# Patient Record
Sex: Male | Born: 1995 | Hispanic: Yes | Marital: Single | Smoking: Current every day smoker
Health system: Southern US, Community
[De-identification: ages and names within clinical notes are randomized; demographics above are authoritative.]

## PROBLEM LIST (undated history)

## (undated) DIAGNOSIS — F1010 Alcohol abuse, uncomplicated: Secondary | ICD-10-CM

## (undated) HISTORY — DX: Alcohol abuse, uncomplicated: F10.10

---

## 2019-09-04 ENCOUNTER — Other Ambulatory Visit: Payer: Self-pay

## 2019-09-04 ENCOUNTER — Emergency Department (HOSPITAL_COMMUNITY)
Admission: EM | Admit: 2019-09-04 | Discharge: 2019-09-04 | Disposition: A | Discharge status: Home / Self Care | Payer: Self-pay | Attending: Emergency Medicine | Admitting: Emergency Medicine

## 2019-09-04 ENCOUNTER — Emergency Department (HOSPITAL_COMMUNITY): Payer: Self-pay

## 2019-09-04 ENCOUNTER — Encounter (HOSPITAL_COMMUNITY): Payer: Self-pay

## 2019-09-04 DIAGNOSIS — R1011 Right upper quadrant pain: Secondary | ICD-10-CM | POA: Insufficient documentation

## 2019-09-04 LAB — COMPREHENSIVE METABOLIC PANEL
ALT: 35 U/L (ref 0–44)
AST: 31 U/L (ref 15–41)
Albumin: 5 g/dL (ref 3.5–5.0)
Alkaline Phosphatase: 69 U/L (ref 38–126)
Anion gap: 16 — ABNORMAL HIGH (ref 5–15)
BUN: <5 mg/dL — ABNORMAL LOW (ref 6–20)
CO2: 25 mmol/L (ref 22–32)
Calcium: 10.2 mg/dL (ref 8.9–10.3)
Chloride: 98 mmol/L (ref 98–111)
Creatinine, Ser: 0.9 mg/dL (ref 0.61–1.24)
GFR calc Af Amer: >60 mL/min (ref >60)
GFR calc non Af Amer: >60 mL/min (ref >60)
Glucose, Bld: 103 mg/dL — ABNORMAL HIGH (ref 70–99)
Potassium: 3.6 mmol/L (ref 3.5–5.1)
Sodium: 139 mmol/L (ref 135–145)
Total Bilirubin: 1.3 mg/dL — ABNORMAL HIGH (ref 0.3–1.2)
Total Protein: 7.4 g/dL (ref 6.5–8.1)

## 2019-09-04 LAB — LIPASE, BLOOD: Lipase: 22 U/L (ref 11–51)

## 2019-09-04 LAB — CBC
HCT: 46.4 % (ref 39.0–52.0)
Hemoglobin: 15.8 g/dL (ref 13.0–17.0)
MCH: 28.7 pg (ref 26.0–34.0)
MCHC: 34.1 g/dL (ref 30.0–36.0)
MCV: 84.4 fL (ref 80.0–100.0)
Platelets: 241 10*3/uL (ref 150–400)
RBC: 5.5 MIL/uL (ref 4.22–5.81)
RDW: 12.8 % (ref 11.5–15.5)
WBC: 11.9 10*3/uL — ABNORMAL HIGH (ref 4.0–10.5)
nRBC: 0 % (ref 0.0–0.2)

## 2019-09-04 MED ORDER — PANTOPRAZOLE SODIUM 20 MG PO TBEC: 20.0000 mg | DELAYED_RELEASE_TABLET | Freq: Two times a day (BID) | ORAL | 0 refills | Status: AC

## 2019-09-04 MED ORDER — ONDANSETRON 8 MG PO TBDP: 8.0000 mg | ORAL_TABLET | Freq: Three times a day (TID) | ORAL | 0 refills | Status: AC | PRN

## 2019-09-04 MED ORDER — ALUM & MAG HYDROXIDE-SIMETH 200-200-20 MG/5ML PO SUSP
30.0000 mL | Freq: Once | ORAL | Status: AC
  Administered 2019-09-04: 30 mL via ORAL
  Filled 2019-09-04: qty 30

## 2019-09-04 MED ORDER — FAMOTIDINE 20 MG PO TABS
20.0000 mg | ORAL_TABLET | Freq: Once | ORAL | Status: AC
  Administered 2019-09-04: 20 mg via ORAL
  Filled 2019-09-04: qty 1

## 2019-09-04 NOTE — ED Triage Notes (Signed)
Patient complains of right sided rib pain and pain with inspiration x 1 day. Denies trauma. Also concerned because he thinks he may have ETOH poisoning because of heavy daily use and complains of nausea and vomiting. Alert and oriented, NAD

## 2019-09-04 NOTE — Discharge Instructions (Addendum)
Take the medication as needed for nausea and for your stomach. Avoid drinking any alcohol. Follow-up with the outpatient resources as we discussed

## 2019-09-04 NOTE — ED Provider Notes (Signed)
[facility] EMERGENCY DEPARTMENT Provider Note   CSN: [id number] Arrival date & time: 09/04/19  1136     History Right upper quadrant abdominal pain  Bill Fowler is a 24 y.o. male.  HPI   Patient states he has been having pain in his right upper quadrant for the last day or so.  Started last night when he woke up this morning it was worse.  Pain is in the right upper abdomen.  It does increase with breathing.  He denies any fevers chills coughing.  He has had some episodes of nausea and vomiting and also gets worse after that.  Patient does have history of alcohol abuse.  Patient states he recently relapsed and has been drinking heavily for the last 3 days.  No known Covid exposure  History reviewed. No pertinent past medical history.  There are no problems to display for this patient.   History reviewed. No pertinent surgical history.     No family history on file.  Social History   Tobacco Use  . Smoking status: Not on file  Substance Use Topics  . Alcohol use: Not on file  . Drug use: Not on file    Home Medications Prior to Admission medications   Medication Sig Start Date End Date Taking? Authorizing Provider  ondansetron (ZOFRAN ODT) 8 MG disintegrating tablet Take 1 tablet (8 mg total) by mouth every 8 (eight) hours as needed for nausea or vomiting. 09/04/19   Linwood Dibbles, MD  pantoprazole (PROTONIX) 20 MG tablet Take 1 tablet (20 mg total) by mouth 2 (two) times daily. 09/04/19   Linwood Dibbles, MD    Allergies    Patient has no known allergies.  Review of Systems   Review of Systems  All other systems reviewed and are negative.   Physical Exam Updated Vital Signs BP (!) 165/82 (BP Location: Right Arm)   Pulse (!) 41   Temp 99.2 F (37.3 C) (Oral)   Resp 16   SpO2 100%   Physical Exam Vitals and nursing note reviewed.  Constitutional:      General: He is not in acute distress.    Appearance: He is well-developed.  HENT:     Head:  Normocephalic and atraumatic.     Right Ear: External ear normal.     Left Ear: External ear normal.  Eyes:     General: No scleral icterus.       Right eye: No discharge.        Left eye: No discharge.     Conjunctiva/sclera: Conjunctivae normal.  Neck:     Trachea: No tracheal deviation.  Cardiovascular:     Rate and Rhythm: Normal rate and regular rhythm.  Pulmonary:     Effort: Pulmonary effort is normal. No respiratory distress.     Breath sounds: Normal breath sounds. No stridor. No wheezing or rales.  Abdominal:     General: Bowel sounds are normal. There is no distension.     Palpations: Abdomen is soft.     Tenderness: There is abdominal tenderness in the right upper quadrant. There is no guarding or rebound.  Musculoskeletal:        General: No tenderness.     Cervical back: Neck supple.  Skin:    General: Skin is warm and dry.     Findings: No rash.  Neurological:     Mental Status: He is alert.     Cranial Nerves: No cranial nerve deficit (no facial droop,  extraocular movements intact, no slurred speech).     Sensory: No sensory deficit.     Motor: No abnormal muscle tone or seizure activity.     Coordination: Coordination normal.     Comments: No tremor     ED Results / Procedures / Treatments   Labs (all labs ordered are listed, but only abnormal results are displayed) Labs Reviewed  COMPREHENSIVE METABOLIC PANEL - Abnormal; Notable for the following components:      Result Value   Glucose, Bld 103 (*)    BUN <5 (*)    Total Bilirubin 1.3 (*)    Anion gap 16 (*)    All other components within normal limits  CBC - Abnormal; Notable for the following components:   WBC 11.9 (*)    All other components within normal limits  LIPASE, BLOOD  URINALYSIS, ROUTINE W REFLEX MICROSCOPIC  COMPREHENSIVE METABOLIC PANEL    EKG None  Radiology DG Chest 2 View  Result Date: 09/04/2019 CLINICAL DATA:  Rib pain.  Pain with inspiration.  No trauma. EXAM: CHEST -  2 VIEW COMPARISON:  None. FINDINGS: The heart size and mediastinal contours are within normal limits. Both lungs are clear. The visualized skeletal structures are unremarkable. IMPRESSION: No active cardiopulmonary disease. Electronically Signed   By: Gerome Sam III M.D   On: 09/04/2019 13:50   US Abdomen Limited RUQ  Result Date: 09/04/2019 CLINICAL DATA:  Right upper quadrant pain. EXAM: ULTRASOUND ABDOMEN LIMITED RIGHT UPPER QUADRANT COMPARISON:  None. FINDINGS: Gallbladder: A 3.9 cm nonshadowing echogenic polyp is seen along the nondependent portion of the gallbladder wall. No gallstones or wall thickening visualized (2.2 mm). No sonographic Murphy sign noted by sonographer. Common bile duct: Diameter: 1.8 mm Liver: No focal lesion identified. Within normal limits in parenchymal echogenicity. Portal vein is patent on color Doppler imaging with normal direction of blood flow towards the liver. Other: None. IMPRESSION: 1. 3.9 mm gallbladder polyp. 2. No evidence of cholelithiasis or acute cholecystitis. Electronically Signed   By: Aram Candela M.D.   On: 09/04/2019 20:17    Procedures Procedures (including critical care time)  Medications Ordered in ED Medications  famotidine (PEPCID) tablet 20 mg (20 mg Oral Given 09/04/19 2036)  alum & mag hydroxide-simeth (MAALOX/MYLANTA) 200-200-20 MG/5ML suspension 30 mL (30 mLs Oral Given 09/04/19 2035)    ED Course  I have reviewed the triage vital signs and the nursing notes.  Pertinent labs & imaging results that were available during my care of the patient were reviewed by me and considered in my medical decision making (see chart for details).  Clinical Course as of Sep 04 2050  Sat Sep 04, 2019  2038 [place] without signs of cholecystitis or cholelithiasis   [JK]  2039 Slight elevation in bilirubin and slight leukocytosis   [JK]  2039 Chest x-ray without signs of pneumonia.  Low risk for PE PERC negative   [JK]    Clinical Course User  Index [JK] Linwood Dibbles, MD   MDM Rules/Calculators/A&P                          Patient presented to ED for evaluation of upper abdominal pain pain with breathing. Patient does admit to heavy alcohol use. On my exam the patient did have tenderness palpation right upper quadrant. No fevers or cough. No shortness of breath. I doubt pulmonary embolism. No pneumonia on chest x-ray. No pneumothorax. I was concerned about the possibility  of biliary colic with his right upper quadrant tenderness. Ultrasound does not show gallstones or cholecystitis. It is possible his symptoms may be related to his alcohol binge recently. Patient is currently not intoxicated. He is going to try to get some outpatient help regarding his drinking. No signs of DTs or withdrawal. Plan on discharge home with antacids antinausea medications. Final Clinical Impression(s) / ED Diagnoses Final diagnoses:  RUQ pain  Right upper quadrant abdominal pain    Rx / DC Orders ED Discharge Orders         Ordered    pantoprazole (PROTONIX) 20 MG tablet  2 times daily        09/04/19 2051    ondansetron (ZOFRAN ODT) 8 MG disintegrating tablet  Every 8 hours PRN        09/04/19 2051           Linwood Dibbles, MD 09/04/19 2053

## 2019-10-05 ENCOUNTER — Encounter (HOSPITAL_COMMUNITY): Payer: Self-pay | Admitting: *Deleted

## 2019-10-05 ENCOUNTER — Emergency Department (HOSPITAL_COMMUNITY): Payer: Self-pay

## 2019-10-05 ENCOUNTER — Other Ambulatory Visit: Payer: Self-pay

## 2019-10-05 ENCOUNTER — Emergency Department (HOSPITAL_COMMUNITY)
Admission: EM | Admit: 2019-10-05 | Discharge: 2019-10-05 | Disposition: A | Discharge status: Home / Self Care | Payer: Self-pay | Attending: Emergency Medicine | Admitting: Emergency Medicine

## 2019-10-05 DIAGNOSIS — Z5321 Procedure and treatment not carried out due to patient leaving prior to being seen by health care provider: Secondary | ICD-10-CM | POA: Insufficient documentation

## 2019-10-05 DIAGNOSIS — R1110 Vomiting, unspecified: Secondary | ICD-10-CM | POA: Insufficient documentation

## 2019-10-05 DIAGNOSIS — R0789 Other chest pain: Secondary | ICD-10-CM | POA: Insufficient documentation

## 2019-10-05 LAB — TROPONIN I (HIGH SENSITIVITY): Troponin I (High Sensitivity): 3 ng/L (ref <18)

## 2019-10-05 LAB — BASIC METABOLIC PANEL
Anion gap: 10 (ref 5–15)
BUN: 6 mg/dL (ref 6–20)
CO2: 27 mmol/L (ref 22–32)
Calcium: 9.3 mg/dL (ref 8.9–10.3)
Chloride: 99 mmol/L (ref 98–111)
Creatinine, Ser: 0.91 mg/dL (ref 0.61–1.24)
GFR calc Af Amer: >60 mL/min (ref >60)
GFR calc non Af Amer: >60 mL/min (ref >60)
Glucose, Bld: 116 mg/dL — ABNORMAL HIGH (ref 70–99)
Potassium: 3.5 mmol/L (ref 3.5–5.1)
Sodium: 136 mmol/L (ref 135–145)

## 2019-10-05 LAB — CBC
HCT: 43.2 % (ref 39.0–52.0)
Hemoglobin: 14.5 g/dL (ref 13.0–17.0)
MCH: 29.1 pg (ref 26.0–34.0)
MCHC: 33.6 g/dL (ref 30.0–36.0)
MCV: 86.7 fL (ref 80.0–100.0)
Platelets: 229 10*3/uL (ref 150–400)
RBC: 4.98 MIL/uL (ref 4.22–5.81)
RDW: 12.9 % (ref 11.5–15.5)
WBC: 9.4 10*3/uL (ref 4.0–10.5)
nRBC: 0 % (ref 0.0–0.2)

## 2019-10-05 NOTE — ED Triage Notes (Signed)
Pt states started having chest pain last night.  Pt states that he had a etoh drinking binge and then stopped last nite.  Last ETOH was at 0100. Pt states he has a seizure before when he stopped drinking. Pt states left sided chest pain and has been vomiting.

## 2019-10-05 NOTE — ED Notes (Signed)
Name called fo updated vitals, no repsonse

## 2021-08-19 IMAGING — DX DG CHEST 2V
2 series · 2 of 2 positions shown · non-contrast
Comparison: None.

CLINICAL DATA: Rib pain.  Pain with inspiration.  No trauma.

EXAM:
CHEST - 2 VIEW

[w chest pa]
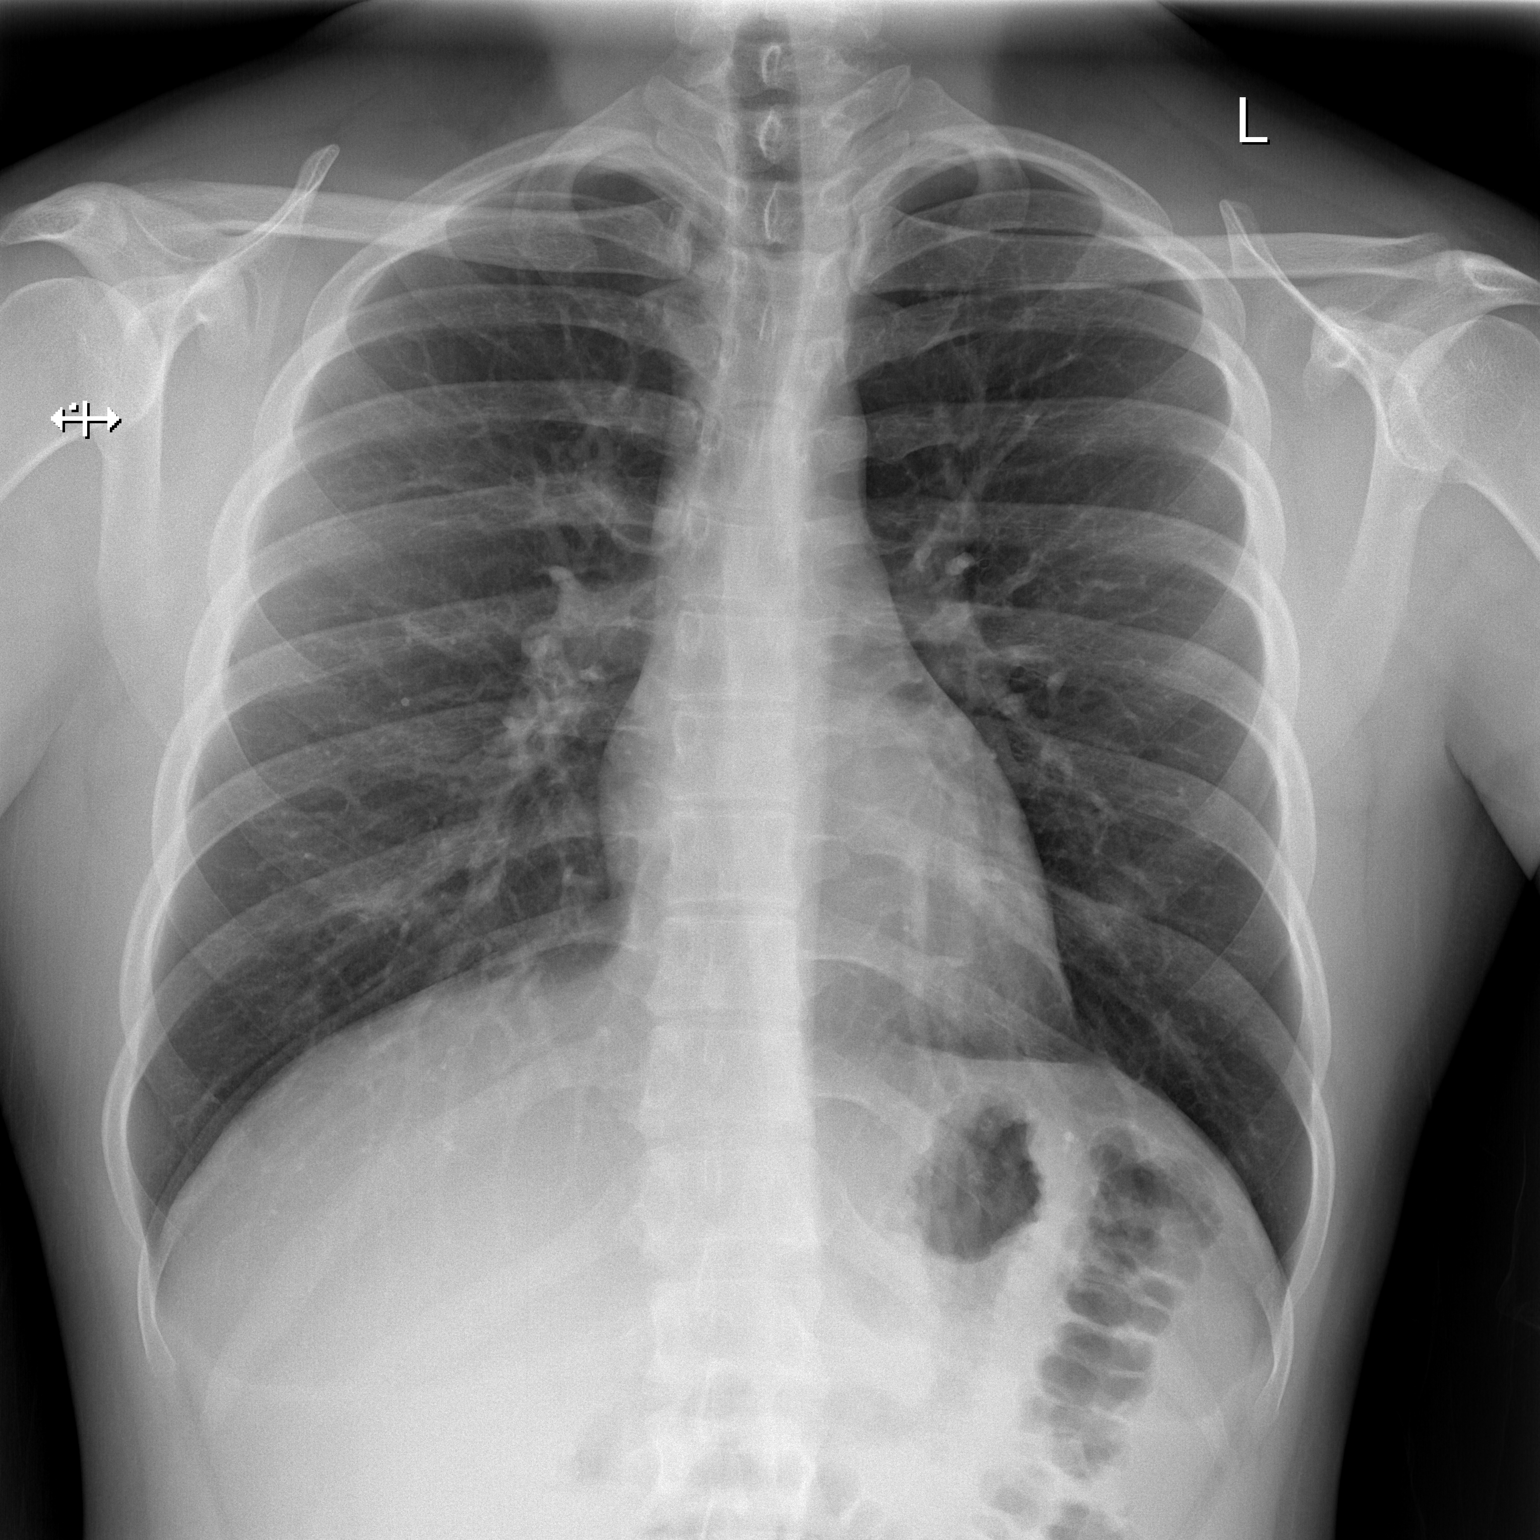

[w chest lat]
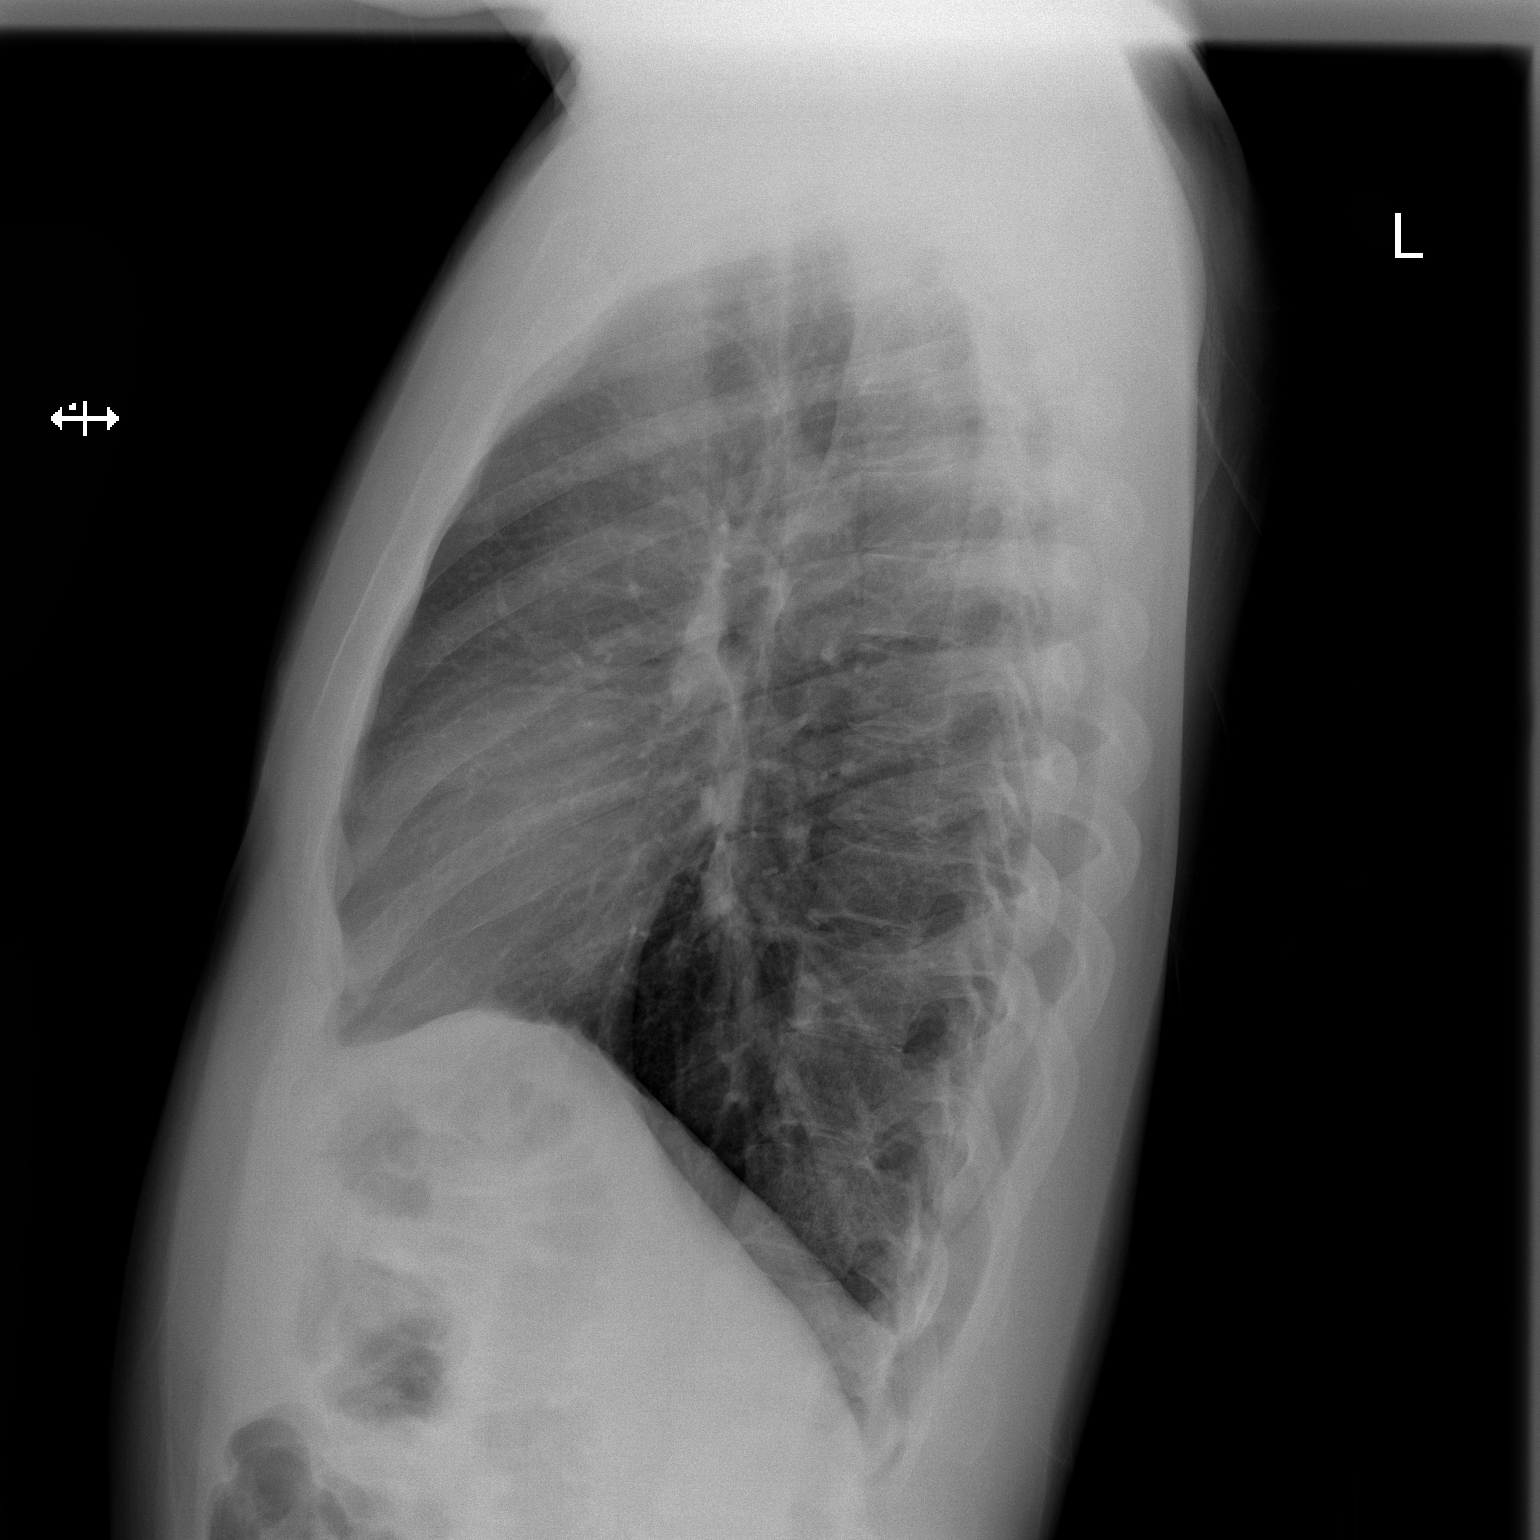

[2 of 2 positions shown; findings below may reference images not displayed]

FINDINGS: The heart size and mediastinal contours are within normal limits.
Both lungs are clear. The visualized skeletal structures are
unremarkable.
IMPRESSION: No active cardiopulmonary disease.

## 2021-08-19 IMAGING — US US ABDOMEN LIMITED
1 series · 14 of 25 positions shown · non-contrast
Comparison: None.

CLINICAL DATA: Right upper quadrant pain.

EXAM:
ULTRASOUND ABDOMEN LIMITED RIGHT UPPER QUADRANT

[Series 1: us abdomen limited ruq · 14 of 38 slices shown]
[im 1/38]
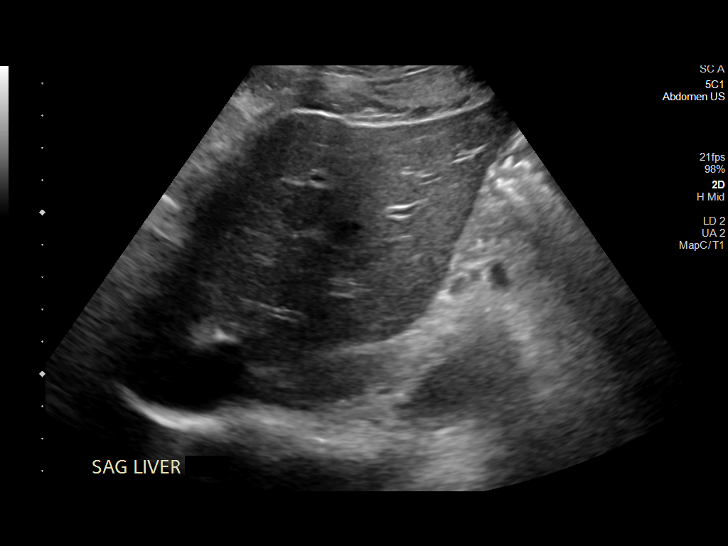
[im 4/38]
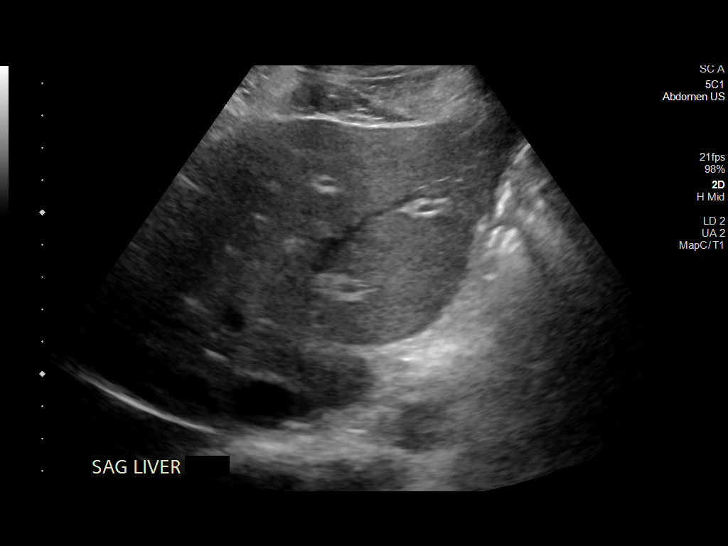
[im 7/38]
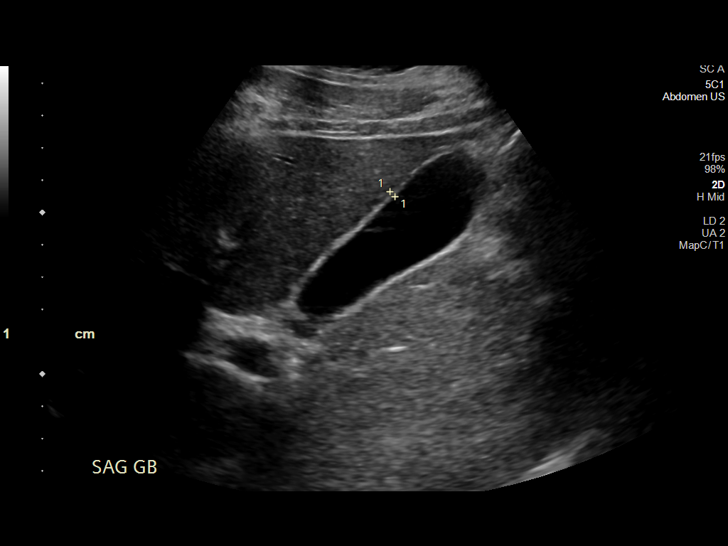
[im 10/38]
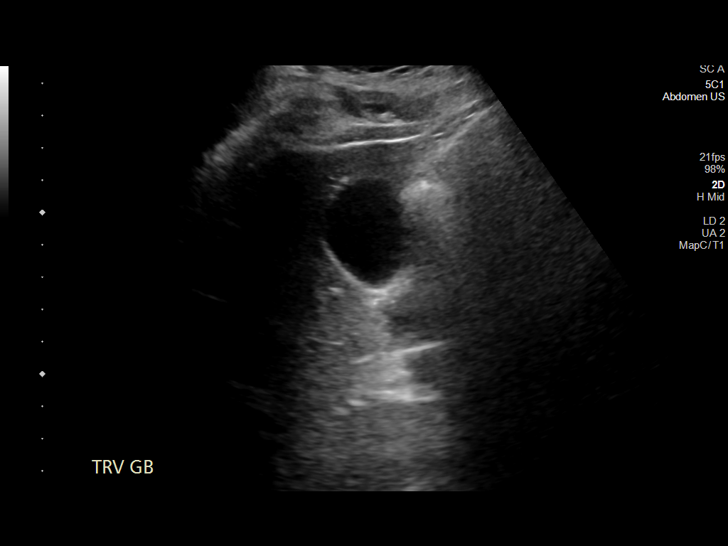
[im 13/38]
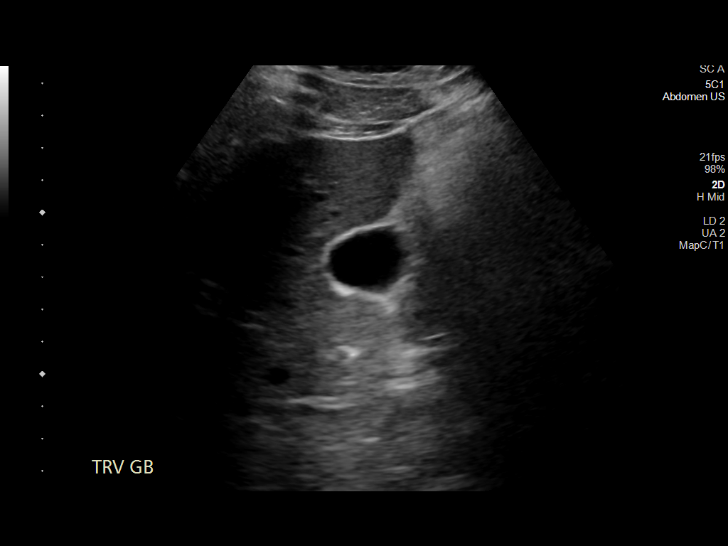
[im 14/38]
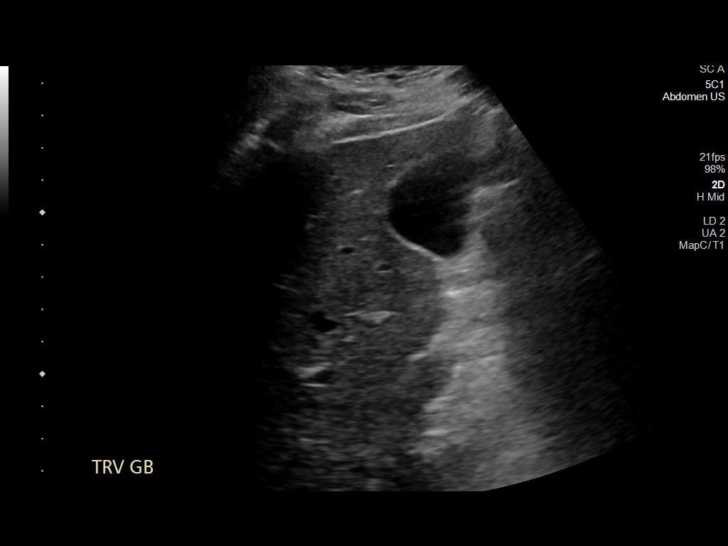
[im 17/38]
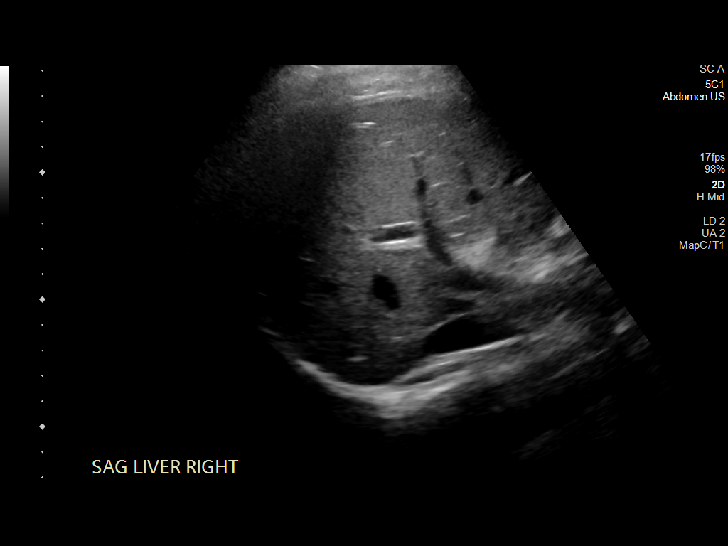
[im 21/38]
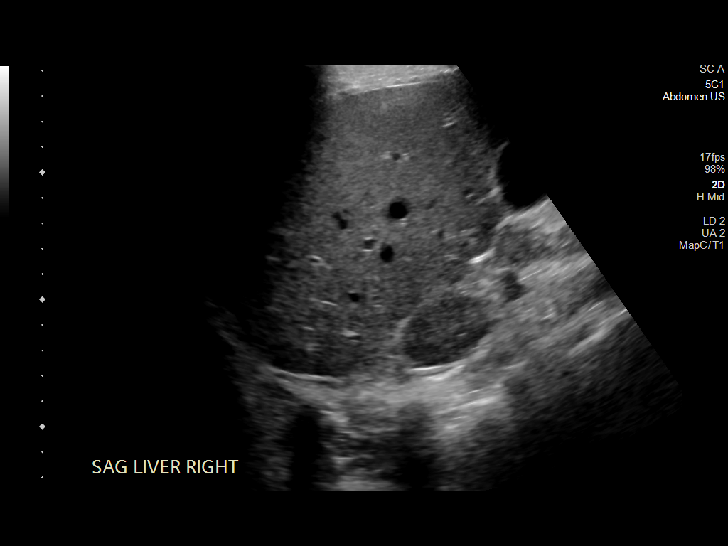
[im 24/38]
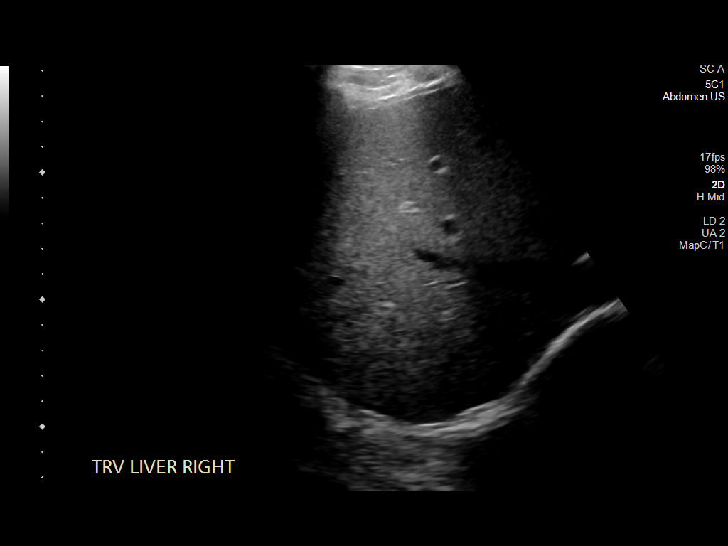
[im 25/38]
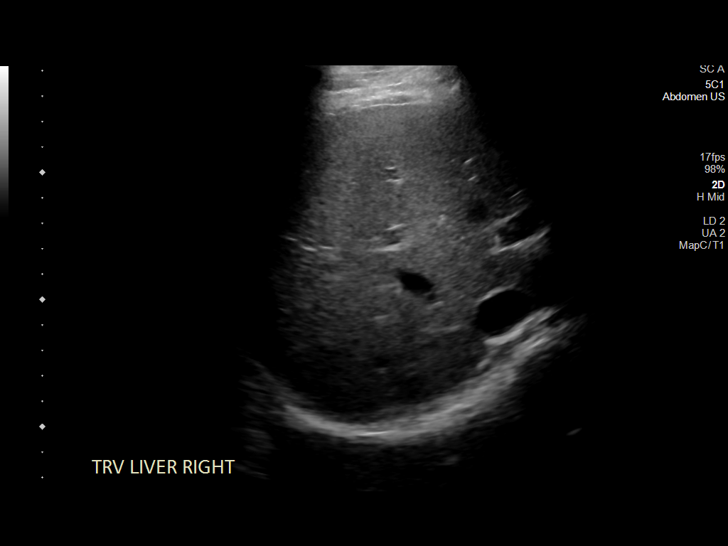
[im 28/38]
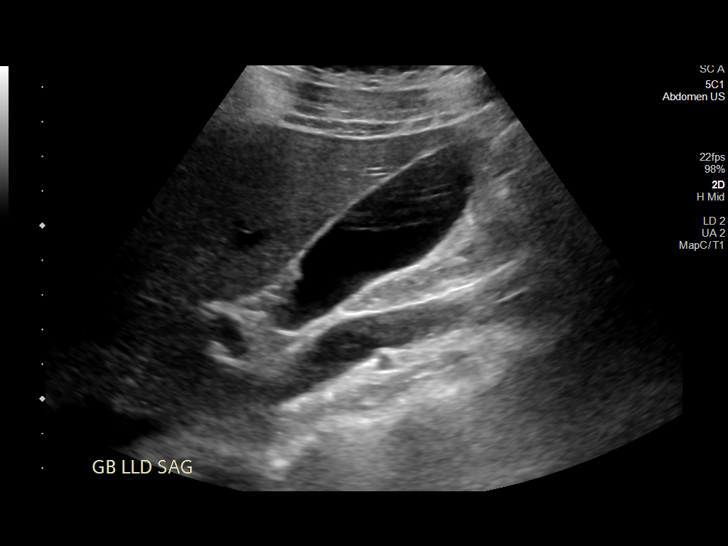
[im 31/38]
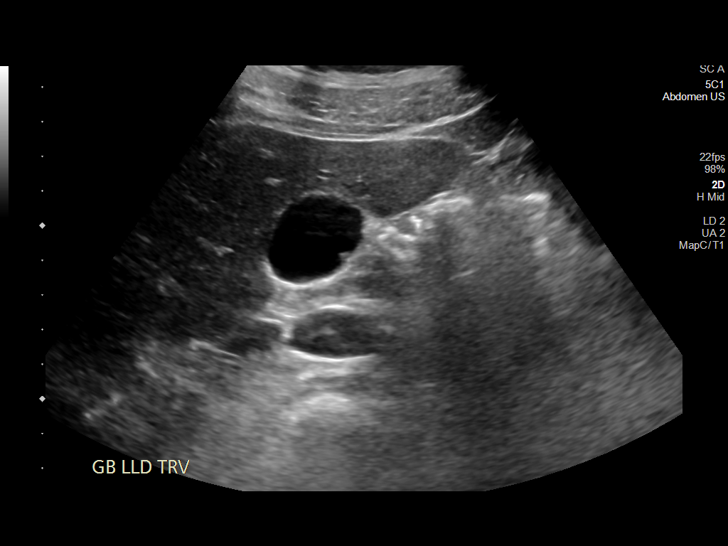
[im 34/38]
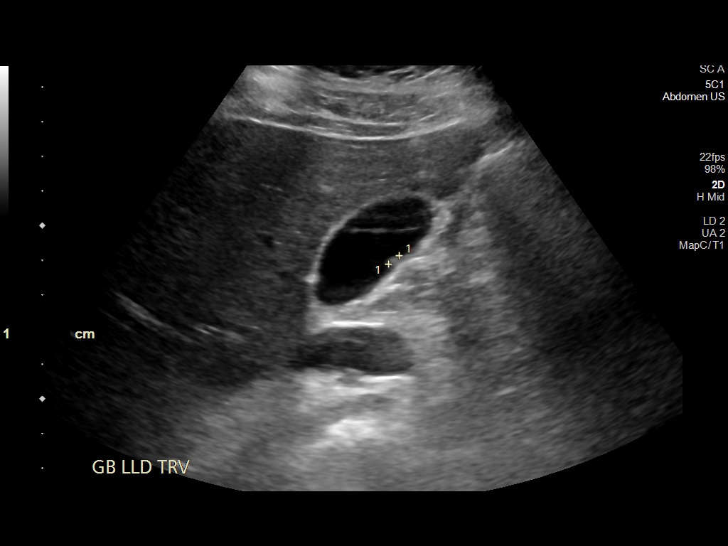
[im 38/38]
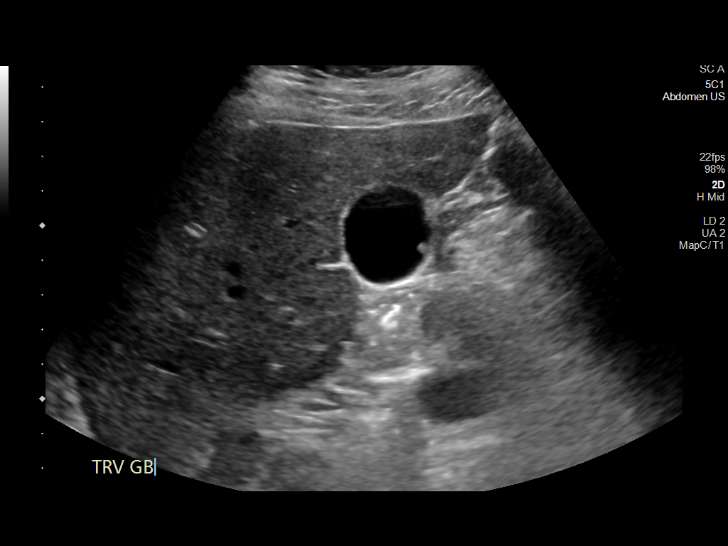

[14 of 25 positions shown; findings below may reference images not displayed]

FINDINGS: Gallbladder:

A 3.9 cm nonshadowing echogenic polyp is seen along the nondependent
portion of the gallbladder wall. No gallstones or wall thickening
visualized (2.2 mm). No sonographic Murphy sign noted by
sonographer.

Common bile duct:

Diameter: 1.8 mm

Liver:

No focal lesion identified. Within normal limits in parenchymal
echogenicity. Portal vein is patent on color Doppler imaging with
normal direction of blood flow towards the liver.

Other: None.
IMPRESSION: 1. 3.9 mm gallbladder polyp.
2. No evidence of cholelithiasis or acute cholecystitis.

## 2021-09-19 IMAGING — CR DG CHEST 2V
2 series · 2 of 2 positions shown · non-contrast
Comparison: September 04, 2019.

CLINICAL DATA: Chest pain.

EXAM:
CHEST - 2 VIEW

[chest pa]
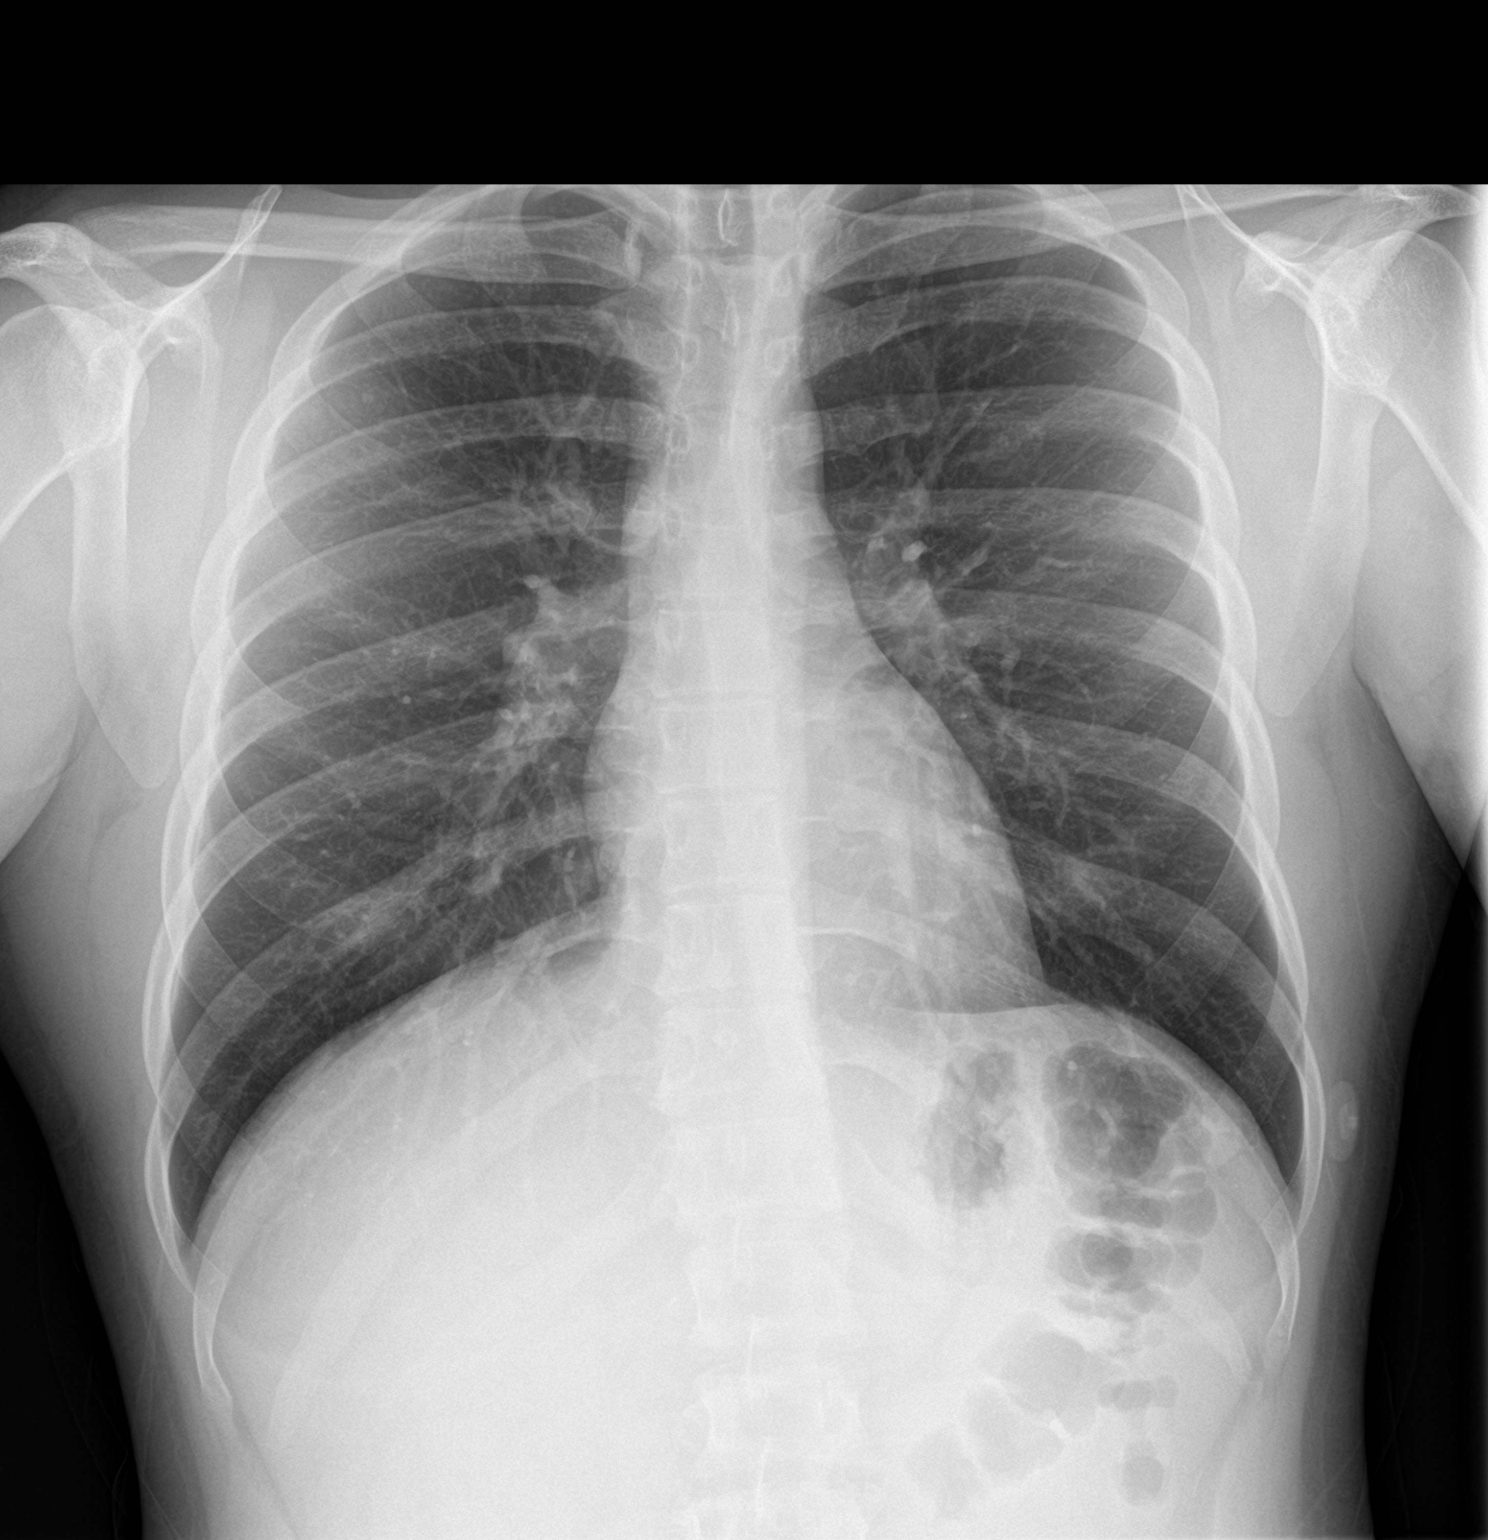

[chest lat]
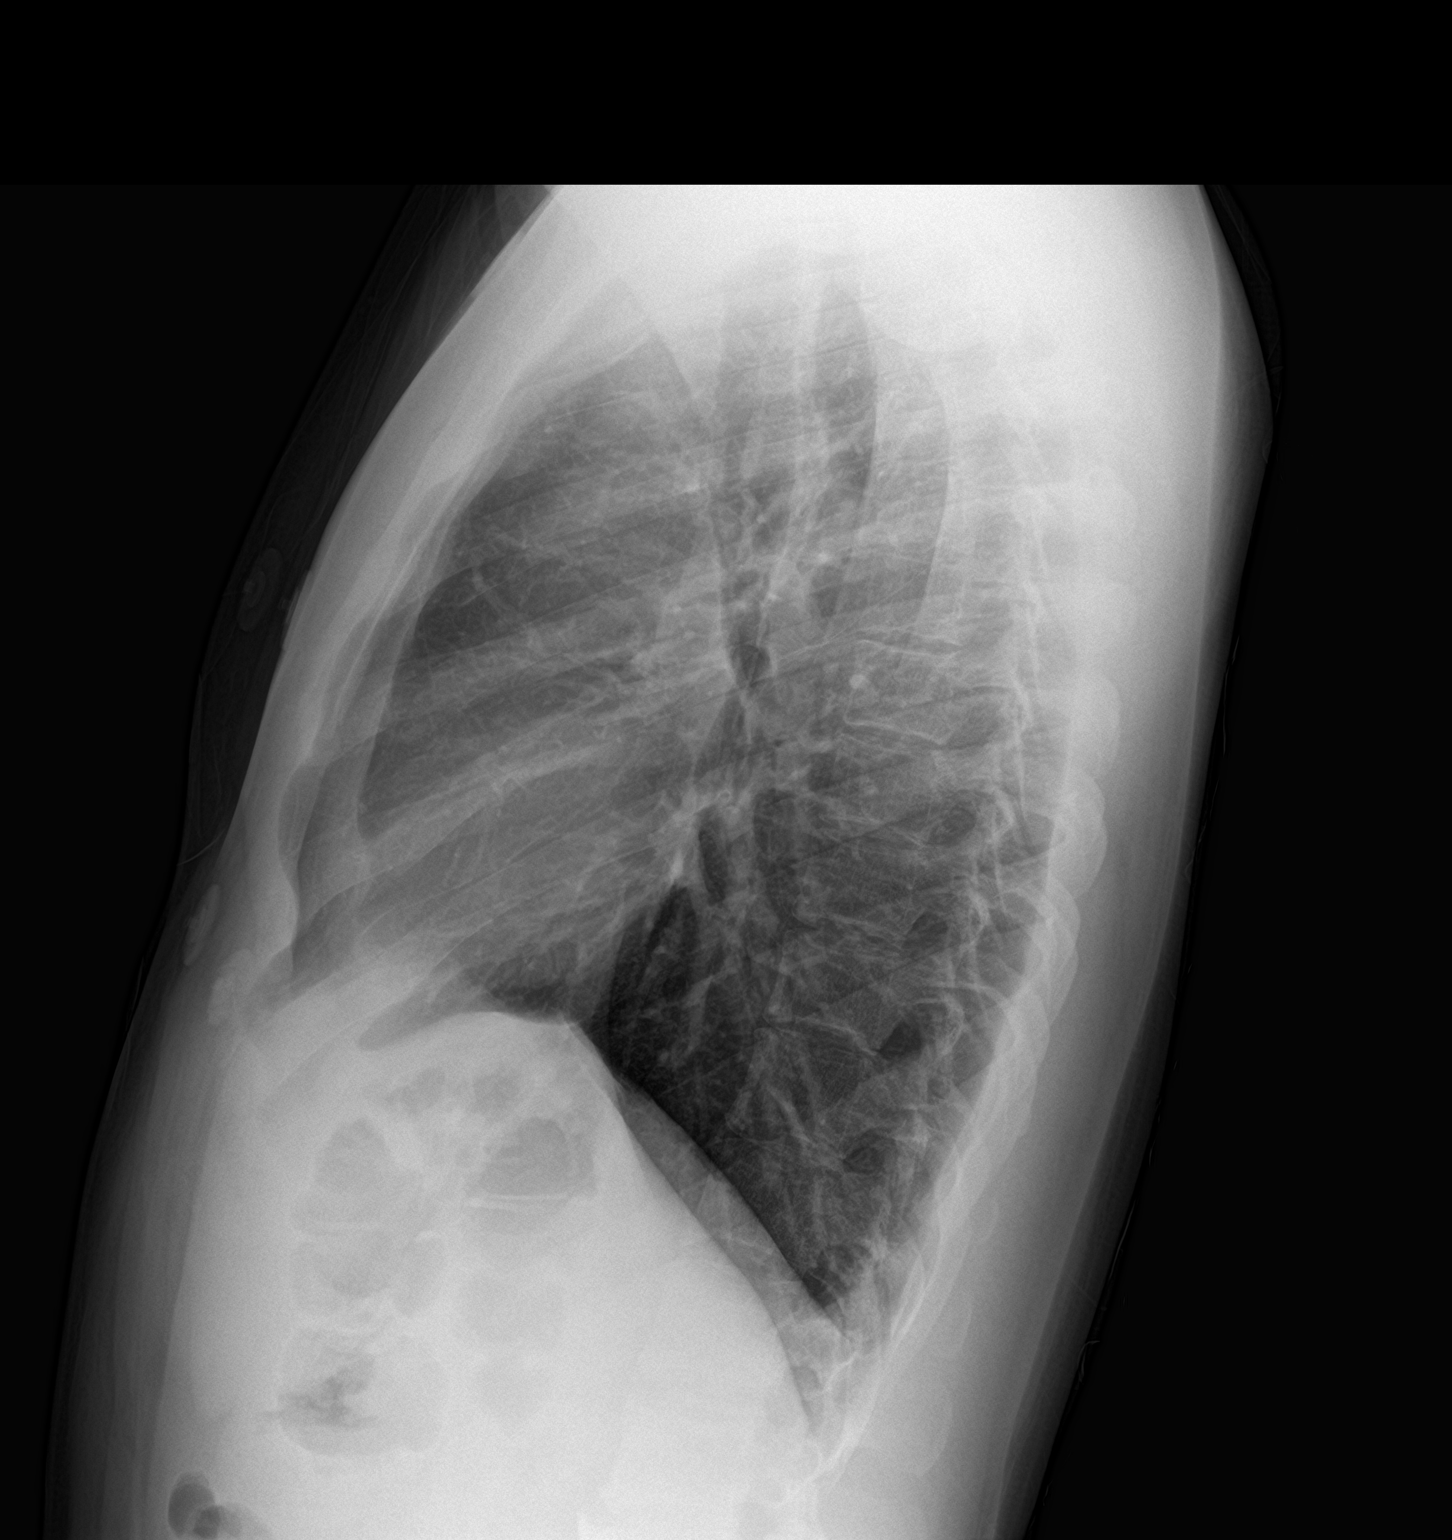

[2 of 2 positions shown; findings below may reference images not displayed]

FINDINGS: The heart size and mediastinal contours are within normal limits.
Both lungs are clear. No pneumothorax or pleural effusion is noted.
The visualized skeletal structures are unremarkable.
IMPRESSION: No active cardiopulmonary disease.
